# Patient Record
Sex: Male | Born: 1965 | Race: Black or African American | Hispanic: No | Marital: Married | Smoking: Never smoker
Health system: Southern US, Community
[De-identification: ages and names within clinical notes are randomized; demographics above are authoritative.]

## PROBLEM LIST (undated history)

## (undated) DIAGNOSIS — I10 Essential (primary) hypertension: Secondary | ICD-10-CM

## (undated) DIAGNOSIS — E119 Type 2 diabetes mellitus without complications: Secondary | ICD-10-CM

---

## 2000-02-27 ENCOUNTER — Emergency Department (HOSPITAL_COMMUNITY): Admission: EM | Admit: 2000-02-27 | Discharge: 2000-02-27 | Payer: Self-pay | Admitting: Internal Medicine

## 2000-02-27 ENCOUNTER — Encounter: Payer: Self-pay | Admitting: Internal Medicine

## 2000-08-23 ENCOUNTER — Emergency Department (HOSPITAL_COMMUNITY): Admission: EM | Admit: 2000-08-23 | Discharge: 2000-08-23 | Payer: Self-pay | Admitting: Emergency Medicine

## 2000-08-23 ENCOUNTER — Encounter: Payer: Self-pay | Admitting: Endocrinology

## 2016-07-23 DIAGNOSIS — Z794 Long term (current) use of insulin: Secondary | ICD-10-CM | POA: Insufficient documentation

## 2016-07-23 DIAGNOSIS — I10 Essential (primary) hypertension: Secondary | ICD-10-CM | POA: Diagnosis not present

## 2016-07-23 DIAGNOSIS — E1165 Type 2 diabetes mellitus with hyperglycemia: Secondary | ICD-10-CM | POA: Diagnosis not present

## 2016-07-23 DIAGNOSIS — R739 Hyperglycemia, unspecified: Secondary | ICD-10-CM | POA: Diagnosis present

## 2016-07-24 ENCOUNTER — Emergency Department (HOSPITAL_COMMUNITY)
Admission: EM | Admit: 2016-07-24 | Discharge: 2016-07-24 | Disposition: A | Discharge status: Home / Self Care | Payer: BLUE CROSS/BLUE SHIELD | Attending: Emergency Medicine | Admitting: Emergency Medicine

## 2016-07-24 ENCOUNTER — Emergency Department (HOSPITAL_COMMUNITY): Payer: BLUE CROSS/BLUE SHIELD

## 2016-07-24 ENCOUNTER — Encounter (HOSPITAL_COMMUNITY): Payer: Self-pay | Admitting: Emergency Medicine

## 2016-07-24 DIAGNOSIS — R739 Hyperglycemia, unspecified: Secondary | ICD-10-CM

## 2016-07-24 DIAGNOSIS — I10 Essential (primary) hypertension: Secondary | ICD-10-CM

## 2016-07-24 HISTORY — DX: Essential (primary) hypertension: I10

## 2016-07-24 HISTORY — DX: Type 2 diabetes mellitus without complications: E11.9

## 2016-07-24 LAB — BASIC METABOLIC PANEL
ANION GAP: 8 (ref 5–15)
BUN: 13 mg/dL (ref 6–20)
CALCIUM: 9.4 mg/dL (ref 8.9–10.3)
CO2: 27 mmol/L (ref 22–32)
CREATININE: 0.89 mg/dL (ref 0.61–1.24)
Chloride: 103 mmol/L (ref 101–111)
GFR calc Af Amer: >60 mL/min (ref >60)
GLUCOSE: 295 mg/dL — AB (ref 65–99)
Potassium: 3.9 mmol/L (ref 3.5–5.1)
Sodium: 138 mmol/L (ref 135–145)

## 2016-07-24 LAB — CBC
HCT: 42.4 % (ref 39.0–52.0)
HEMOGLOBIN: 14.8 g/dL (ref 13.0–17.0)
MCH: 30.1 pg (ref 26.0–34.0)
MCHC: 34.9 g/dL (ref 30.0–36.0)
MCV: 86.2 fL (ref 78.0–100.0)
Platelets: 205 10*3/uL (ref 150–400)
RBC: 4.92 MIL/uL (ref 4.22–5.81)
RDW: 13 % (ref 11.5–15.5)
WBC: 6 10*3/uL (ref 4.0–10.5)

## 2016-07-24 LAB — CBG MONITORING, ED
Glucose-Capillary: 221 mg/dL — ABNORMAL HIGH (ref 65–99)
Glucose-Capillary: 242 mg/dL — ABNORMAL HIGH (ref 65–99)
Glucose-Capillary: 263 mg/dL — ABNORMAL HIGH (ref 65–99)

## 2016-07-24 LAB — I-STAT TROPONIN, ED: TROPONIN I, POC: 0 ng/mL (ref 0.00–0.08)

## 2016-07-24 MED ORDER — INSULIN ASPART 100 UNIT/ML ~~LOC~~ SOLN
5.0000 [IU] | Freq: Once | SUBCUTANEOUS | Status: AC
  Administered 2016-07-24: 5 [IU] via SUBCUTANEOUS
  Filled 2016-07-24: qty 1

## 2016-07-24 MED ORDER — KETOROLAC TROMETHAMINE 15 MG/ML IJ SOLN
10.0000 mg | Freq: Once | INTRAMUSCULAR | Status: AC
  Administered 2016-07-24: 10 mg via INTRAVENOUS
  Filled 2016-07-24: qty 1

## 2016-07-24 MED ORDER — SODIUM CHLORIDE 0.9 % IV BOLUS (SEPSIS)
1000.0000 mL | Freq: Once | INTRAVENOUS | Status: AC
  Administered 2016-07-24: 1000 mL via INTRAVENOUS

## 2016-07-24 NOTE — ED Provider Notes (Signed)
[facility] Provider Note: Lowella DellJ. Lane Sweden Lesure, MD, FACEP  CSN: [id number] MRN: [id number] ARRIVAL: 07/23/16 at 2253 ROOM: WA21/WA21   CHIEF COMPLAINT  Hyperglycemia   HISTORY OF PRESENT ILLNESS  Walter Russell is a 51 y.o. male with a history of hypertension and diabetes. He is supposed be on lisinopril but admits he has not been taking it for several months. He did not take his usual dose of Lantus the day before yesterday and yesterday evening his sugar was noted to be elevated. He states it was 478 at home. He also took his blood pressure and found it to be elevated. This was associated with an occipital headache which he rated as a 7 out of 10 at its worse. The headache had improved but is returning. He took a dose of Lantus and a dose of lisinopril yesterday evening and his sugar was noted to be improved on arrival. His sugar was 221 at 2:51 AM today. He is not having any focal neurologic deficits. He denies chest pain or shortness of breath. He is having some discomfort with swallowing; he feels like he needs to burp but cannot get it up.   Past Medical History:  Diagnosis Date  . Diabetes mellitus without complication (HCC)   . Hypertension     History reviewed. No pertinent surgical history.  History reviewed. No pertinent family history.  Social History  Substance Use Topics  . Smoking status: Never Smoker  . Smokeless tobacco: Never Used  . Alcohol use No    Prior to Admission medications   Medication Sig Start Date End Date Taking? Authorizing Provider  atorvastatin (LIPITOR) 10 MG tablet Take 10 mg by mouth daily after breakfast.   Yes [provider]  insulin glargine (LANTUS) 100 UNIT/ML injection Inject 45 Units into the skin daily after breakfast.   Yes [provider]  lisinopril (PRINIVIL,ZESTRIL) 10 MG tablet Take 10 mg by mouth daily after breakfast.   Yes [provider]    Allergies Patient has no known  allergies.   REVIEW OF SYSTEMS  Negative except as noted here or in the History of Present Illness.   PHYSICAL EXAMINATION  Initial Vital Signs Blood pressure (!) 157/107, pulse 62, temperature 98.5 F (36.9 C), temperature source Oral, resp. rate 17, height 5\' 9"  (1.753 m), weight 81.6 kg (180 lb), SpO2 98 %.  Examination General: Well-developed, well-nourished male in no acute distress; appearance consistent with age of record HENT: normocephalic; atraumatic Eyes: pupils equal, round and reactive to light; extraocular muscles intact Neck: supple Heart: regular rate and rhythm Lungs: clear to auscultation bilaterally Abdomen: soft; nondistended; nontender; bowel sounds present Extremities: No deformity; full range of motion; pulses normal Neurologic: Awake, alert and oriented; motor function intact in all extremities and symmetric; no facial droop Skin: Warm and dry Psychiatric: Normal mood and affect   RESULTS  Summary of this visit's results, reviewed by myself:   EKG Interpretation  Date/Time:  Monday July 24 2016 00:19:44 EDT Ventricular Rate:  74 PR Interval:    QRS Duration: 87 QT Interval:  418 QTC Calculation: 464 R Axis:   -22 Text Interpretation:  Sinus rhythm Borderline left axis deviation Anteroseptal infarct, old Baseline wander in lead(s) V5 V6 No previous ECGs available Confirmed by Paula LibraMolpus, Aloysious Vangieson ([id number]) on 07/24/2016 12:55:15 AM Also confirmed by Paula LibraMolpus, Owyn Raulston ([id number]), editor Jac CanavanWatlington, Beverly ([id number])  on 07/24/2016 6:53:41 AM      Laboratory Studies: Results for orders placed or performed during the hospital  encounter of 07/24/16 (from the past 24 hour(s))  POC CBG, ED     Status: Abnormal   Collection Time: 07/24/16 12:20 AM  Result Value Ref Range   Glucose-Capillary 263 (H) 65 - 99 mg/dL  Basic metabolic panel     Status: Abnormal   Collection Time: 07/24/16 12:28 AM  Result Value Ref Range   Sodium 138 135 - 145 mmol/L   Potassium 3.9 3.5 - 5.1  mmol/L   Chloride 103 101 - 111 mmol/L   CO2 27 22 - 32 mmol/L   Glucose, Bld 295 (H) 65 - 99 mg/dL   BUN 13 6 - 20 mg/dL   Creatinine, Ser 4.78 0.61 - 1.24 mg/dL   Calcium 9.4 8.9 - 29.5 mg/dL   GFR calc non Af Amer >60 >60 mL/min   GFR calc Af Amer >60 >60 mL/min   Anion gap 8 5 - 15  CBC     Status: None   Collection Time: 07/24/16 12:28 AM  Result Value Ref Range   WBC 6.0 4.0 - 10.5 K/uL   RBC 4.92 4.22 - 5.81 MIL/uL   Hemoglobin 14.8 13.0 - 17.0 g/dL   HCT 62.1 30.8 - 65.7 %   MCV 86.2 78.0 - 100.0 fL   MCH 30.1 26.0 - 34.0 pg   MCHC 34.9 30.0 - 36.0 g/dL   RDW 84.6 96.2 - 95.2 %   Platelets 205 150 - 400 K/uL  I-stat troponin, ED     Status: None   Collection Time: 07/24/16 12:37 AM  Result Value Ref Range   Troponin i, poc 0.00 0.00 - 0.08 ng/mL   Comment 3          CBG monitoring, ED     Status: Abnormal   Collection Time: 07/24/16  2:51 AM  Result Value Ref Range   Glucose-Capillary 221 (H) 65 - 99 mg/dL   Comment 1 Notify RN   CBG monitoring, ED     Status: Abnormal   Collection Time: 07/24/16  5:09 AM  Result Value Ref Range   Glucose-Capillary 242 (H) 65 - 99 mg/dL   Imaging Studies: Dg Chest 2 View  Result Date: 07/24/2016 CLINICAL DATA:  Hypertension and left chest pressure EXAM: CHEST  2 VIEW COMPARISON:  Report 02/27/2000 FINDINGS: No acute infiltrate or effusion. Normal cardiomediastinal silhouette. No pneumothorax. IMPRESSION: No active cardiopulmonary disease. Electronically Signed   By: Jasmine Pang M.D.   On: 07/24/2016 00:46    ED COURSE  Nursing notes and initial vitals signs, including pulse oximetry, reviewed.  Vitals:   07/24/16 0230 07/24/16 0242 07/24/16 0408 07/24/16 0510  BP:  (!) 157/107 (!) 154/113 116/77  Pulse: 68 62  (!) 54  Resp: 15 17 19 11   Temp:      TempSrc:      SpO2: 99% 98%  96%  Weight:      Height:       6:53 AM Patient's headache resolved after IV fluid bolus. His blood pressure is now normal. He was advised to  start taking his lisinopril regularly as prescribed.  PROCEDURES    ED DIAGNOSES     ICD-10-CM   1. Hyperglycemia R73.9   2. Hypertension not at goal [facility] [place], Jonny Ruiz, MD 07/24/16 [phone]

## 2016-07-24 NOTE — ED Triage Notes (Addendum)
Patient complaining of headache, high blood pressure, and high blood sugar. Patient states that at home his blood sugar was 400. Patient is complaining of pressure of left chest.

## 2016-07-24 NOTE — ED Notes (Signed)
Patient refused PIV, sts he is only here because his blood sugar and blood pressure were high. He admits to being non-complaint with his medications.

## 2016-08-02 ENCOUNTER — Emergency Department (HOSPITAL_COMMUNITY)
Admission: EM | Admit: 2016-08-02 | Discharge: 2016-08-02 | Disposition: A | Discharge status: Home / Self Care | Payer: BLUE CROSS/BLUE SHIELD | Attending: Emergency Medicine | Admitting: Emergency Medicine

## 2016-08-02 DIAGNOSIS — E119 Type 2 diabetes mellitus without complications: Secondary | ICD-10-CM | POA: Diagnosis not present

## 2016-08-02 DIAGNOSIS — R140 Abdominal distension (gaseous): Secondary | ICD-10-CM | POA: Diagnosis not present

## 2016-08-02 DIAGNOSIS — I10 Essential (primary) hypertension: Secondary | ICD-10-CM | POA: Insufficient documentation

## 2016-08-02 DIAGNOSIS — Z794 Long term (current) use of insulin: Secondary | ICD-10-CM | POA: Diagnosis not present

## 2016-08-02 DIAGNOSIS — K5900 Constipation, unspecified: Secondary | ICD-10-CM | POA: Insufficient documentation

## 2016-08-02 NOTE — ED Triage Notes (Signed)
Pt states that he has felt like his food is getting stuck in his throat x 2 days. Able to eat and drink. Also reports clear discharge from his rectum. Alert and oriented.

## 2016-08-02 NOTE — Discharge Instructions (Signed)
TAKE 8 CAPFULS OF MIRALAX IN A 32 OUNCE GATORADE AND DRINK THE WHOLE BEVERAGE FOLLOWED.  This would be a full cleanout.  Otherwise you can do one scoop in 8oz of water every day until you have significant BM's.

## 2016-08-02 NOTE — ED Notes (Signed)
Bed: WA14 Expected date:  Expected time:  Means of arrival:  Comments: 

## 2016-08-02 NOTE — ED Provider Notes (Signed)
[facility] Provider Note   CSN: [id number] Arrival date & time: 08/02/16  1107     History   Chief Complaint Chief Complaint  Patient presents with  . Food Bolus  . Rectal Discharge    HPI Walter Russell is a 51 y.o. male.  51 yo M with a chief complaints of abdominal bloating and a feeling of stomach fullness. This been going on for the past couple days. He denies fevers or chills. Denies vomiting or diarrhea. He ate steak a few days ago prior to the start of this but denies anything being fully stuffed. Denies any new medications.   The history is provided by the patient.  Illness  This is a new problem. The current episode started yesterday. The problem occurs constantly. The problem has not changed since onset.Pertinent negatives include no chest pain, no abdominal pain, no headaches and no shortness of breath. Nothing aggravates the symptoms. Nothing relieves the symptoms. He has tried nothing for the symptoms. The treatment provided no relief.    Past Medical History:  Diagnosis Date  . Diabetes mellitus without complication (HCC)   . Hypertension     There are no active problems to display for this patient.   No past surgical history on file.     Home Medications    Prior to Admission medications   Medication Sig Start Date End Date Taking? Authorizing Provider  atorvastatin (LIPITOR) 10 MG tablet Take 10 mg by mouth daily after breakfast.   Yes [provider]  calcium carbonate (TUMS) 500 MG chewable tablet Chew 2 tablets by mouth daily as needed for indigestion or heartburn.   Yes [provider]  insulin glargine (LANTUS) 100 UNIT/ML injection Inject 45 Units into the skin daily after breakfast.   Yes [provider]  lisinopril (PRINIVIL,ZESTRIL) 10 MG tablet Take 10 mg by mouth daily after breakfast.   Yes [provider]  Multiple Vitamin (MULTIVITAMIN WITH MINERALS) TABS tablet Take 1 tablet by mouth daily.    Yes [provider]  OVER THE COUNTER MEDICATION Place 2 drops into both eyes daily as needed (contact lens).   Yes [provider]  tadalafil (CIALIS) 20 MG tablet Take 20 mg by mouth daily as needed for erectile dysfunction.   Yes [provider]    Family History No family history on file.  Social History Social History  Substance Use Topics  . Smoking status: Never Smoker  . Smokeless tobacco: Never Used  . Alcohol use No     Allergies   Patient has no known allergies.   Review of Systems Review of Systems  Constitutional: Negative for chills and fever.  HENT: Negative for congestion and facial swelling.   Eyes: Negative for discharge and visual disturbance.  Respiratory: Negative for shortness of breath.   Cardiovascular: Negative for chest pain and palpitations.  Gastrointestinal: Positive for abdominal distention. Negative for abdominal pain, diarrhea and vomiting.  Musculoskeletal: Negative for arthralgias and myalgias.  Skin: Negative for color change and rash.  Neurological: Negative for tremors, syncope and headaches.  Psychiatric/Behavioral: Negative for confusion and dysphoric mood.     Physical Exam Updated Vital Signs BP (!) 151/102 (BP Location: Right Arm)   Pulse 94   Temp 98.7 F (37.1 C) (Oral)   Resp 16   SpO2 99%   Physical Exam  Constitutional: He is oriented to person, place, and time. He appears well-developed and well-nourished.  HENT:  Head: Normocephalic and atraumatic.  Eyes:  Pupils are equal, round, and reactive to light. EOM are normal.  Neck: Normal range of motion. Neck supple. No JVD present.  Cardiovascular: Normal rate and regular rhythm.  Exam reveals no gallop and no friction rub.   No murmur heard. Pulmonary/Chest: No respiratory distress. He has no wheezes.  Abdominal: He exhibits distension. He exhibits no mass. There is no tenderness. There is no rebound and no guarding.  Musculoskeletal:  Normal range of motion.  Neurological: He is alert and oriented to person, place, and time.  Skin: No rash noted. No pallor.  Psychiatric: He has a normal mood and affect. His behavior is normal.  Nursing note and vitals reviewed.    ED Treatments / Results  Labs (all labs ordered are listed, but only abnormal results are displayed) Labs Reviewed - No data to display  EKG  EKG Interpretation None       Radiology No results found.  Procedures Procedures (including critical care time)  Medications Ordered in ED Medications - No data to display   Initial Impression / Assessment and Plan / ED Course  I have reviewed the triage vital signs and the nursing notes.  Pertinent labs & imaging results that were available during my care of the patient were reviewed by me and considered in my medical decision making (see chart for details).     51 yo M With a chief complaint of abdominal bloating.  Going on for past two days.  Unsure of etiology, well appearing non toxic.  ? Viral illness, ? Partial food impaction ? IBS.  Able to tolerate PO.    1:17 PM:  I have discussed the diagnosis/risks/treatment options with the patient and believe the pt to be eligible for discharge home to follow-up with GI. We also discussed returning to the ED immediately if new or worsening sx occur. We discussed the sx which are most concerning (e.g., sudden worsening pain, fever, inability to tolerate by mouth) that necessitate immediate return. Medications administered to the patient during their visit and any new prescriptions provided to the patient are listed below.  Medications given during this visit Medications - No data to display   The patient appears reasonably screen and/or stabilized for discharge and I doubt any other medical condition or other [facility] requiring further screening, evaluation, or treatment in the ED at this time prior to discharge.    Final Clinical Impressions(s) / ED  Diagnoses   Final diagnoses:  Constipation, unspecified constipation type  Bloating    New Prescriptions New Prescriptions   No medications on file     Melene PlanFloyd, Oakley Kossman, DO 08/02/16 1317

## 2016-08-04 ENCOUNTER — Other Ambulatory Visit: Payer: Self-pay | Admitting: Physician Assistant

## 2016-08-04 ENCOUNTER — Other Ambulatory Visit: Payer: Self-pay | Admitting: Cardiology

## 2016-08-04 DIAGNOSIS — S46911A Strain of unspecified muscle, fascia and tendon at shoulder and upper arm level, right arm, initial encounter: Secondary | ICD-10-CM

## 2018-04-04 IMAGING — CR DG CHEST 2V
2 series · 2 of 2 positions shown · non-contrast
Comparison: Report 02/27/2000

CLINICAL DATA: Hypertension and left chest pressure

EXAM:
CHEST  2 VIEW

[w chest pa]
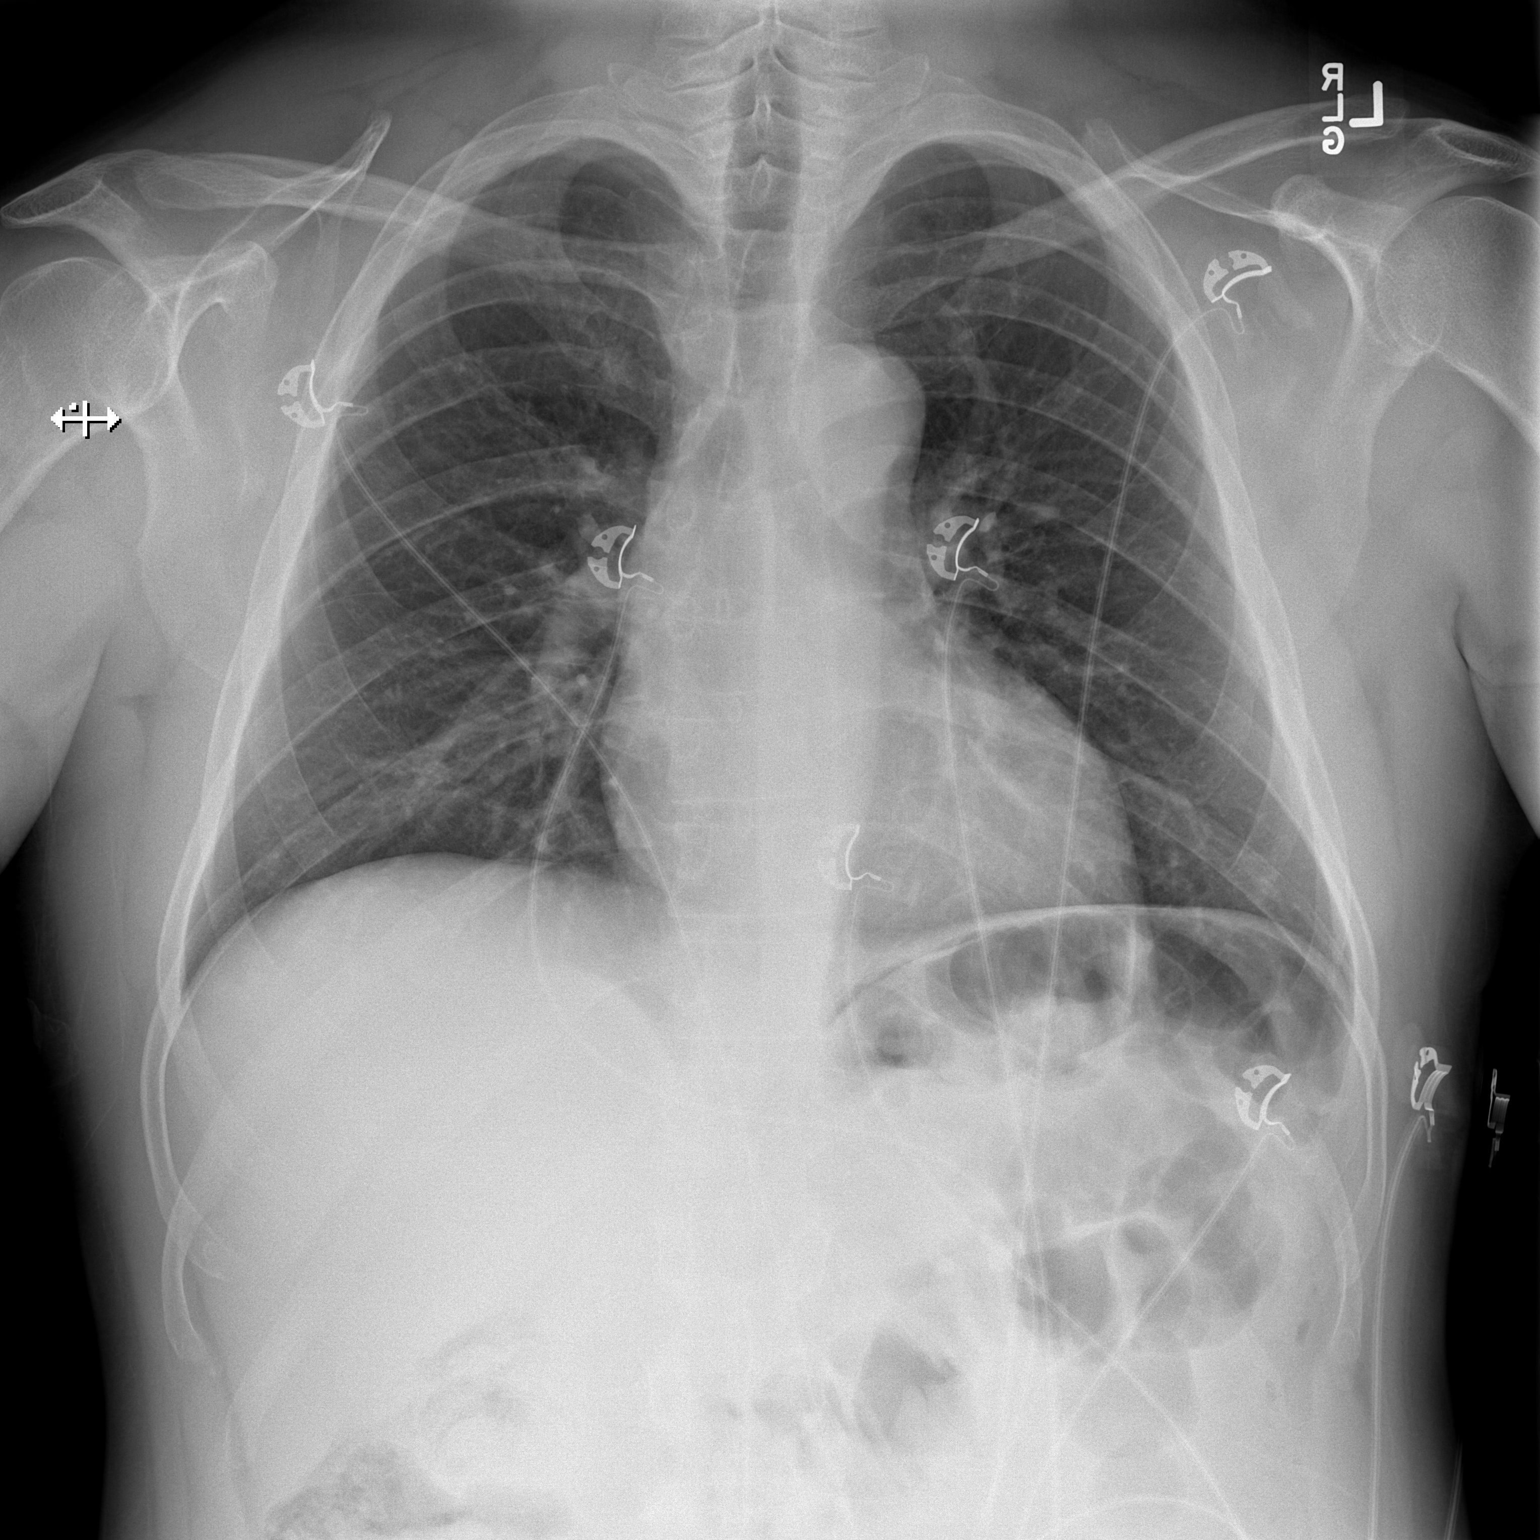

[w chest lat]
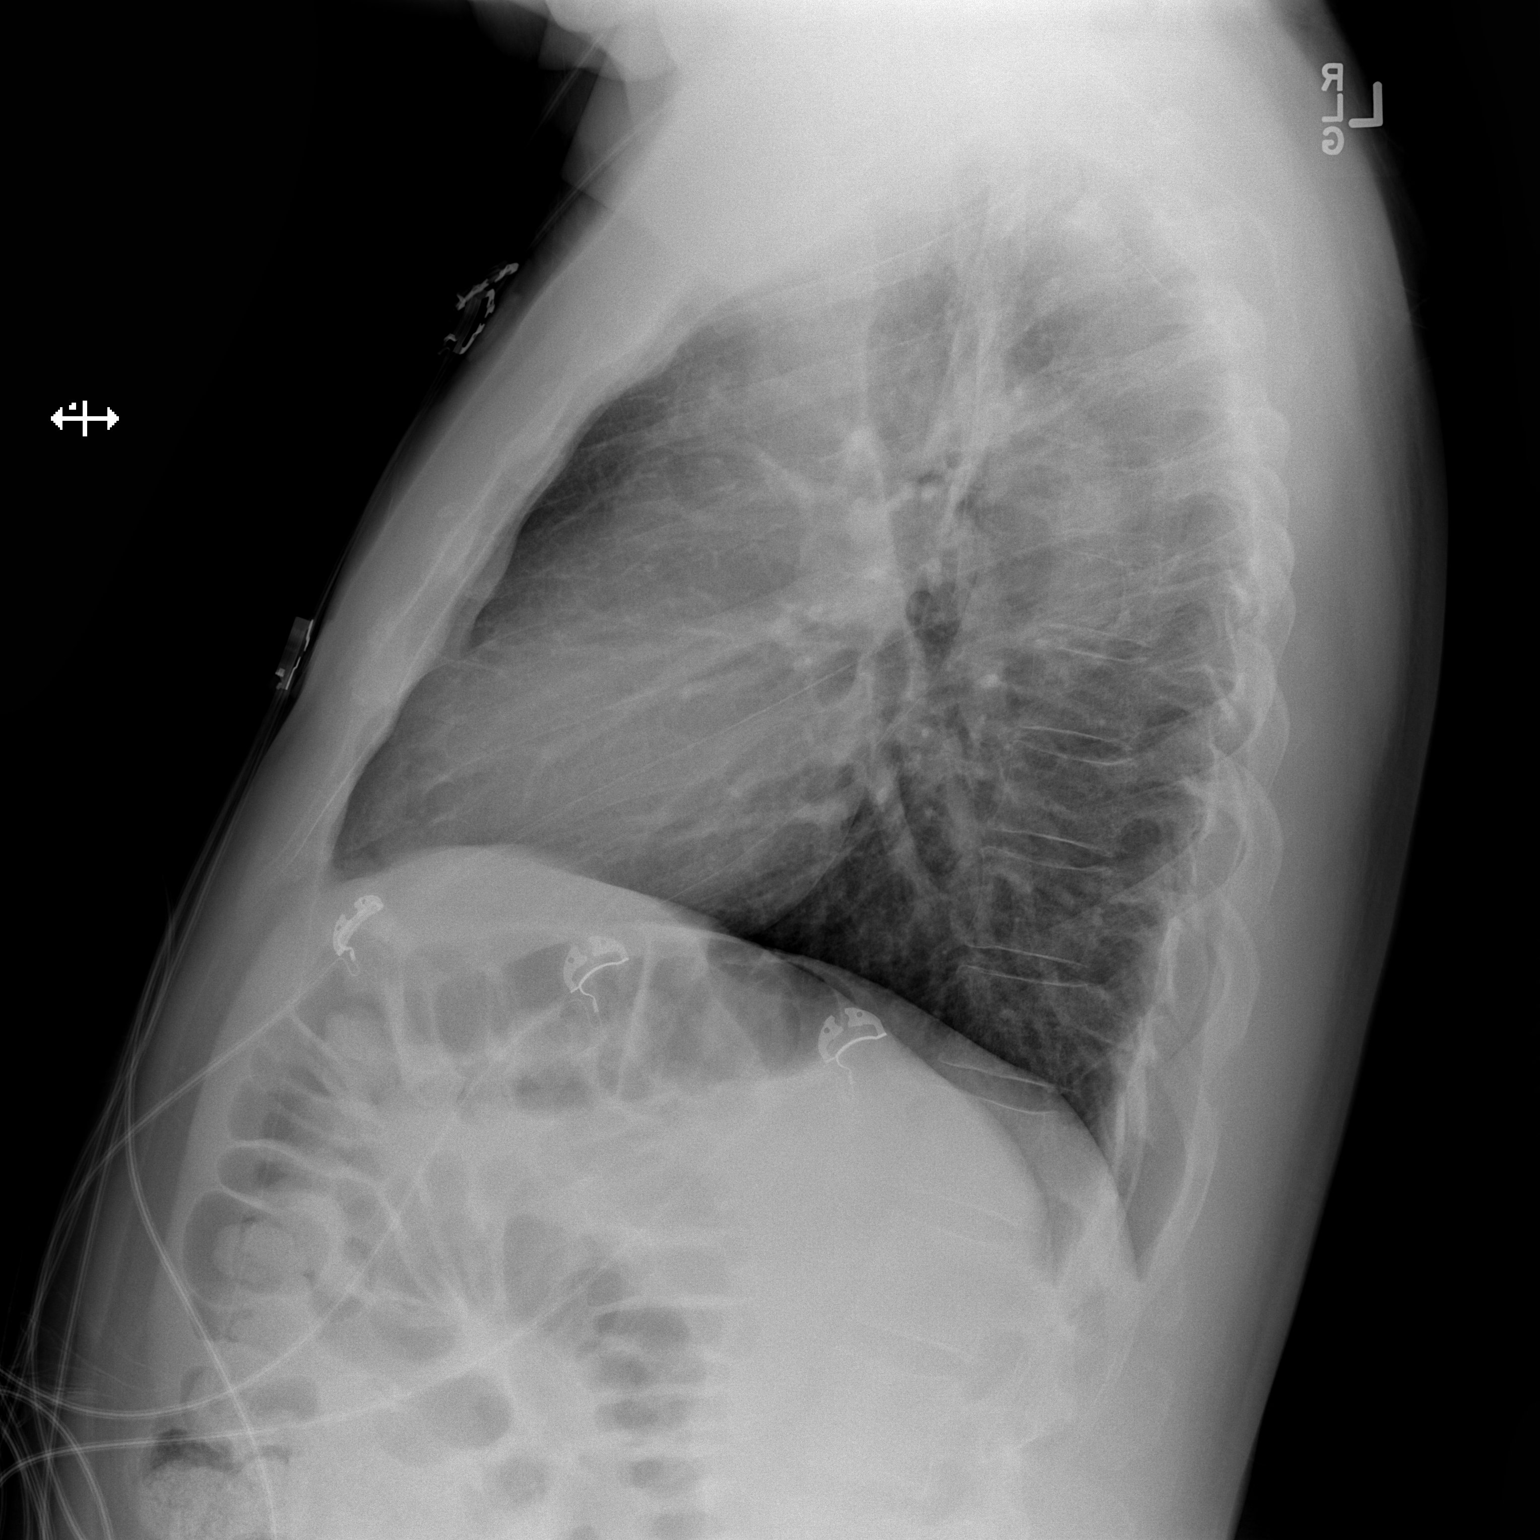

[2 of 2 positions shown; findings below may reference images not displayed]

FINDINGS: No acute infiltrate or effusion. Normal cardiomediastinal
silhouette. No pneumothorax.
IMPRESSION: No active cardiopulmonary disease.
